# Patient Record
Sex: Female | Born: 2000 | Race: Black or African American | Hispanic: No | Marital: Single | Smoking: Never smoker
Health system: Southern US, Community
[De-identification: ages and names within clinical notes are randomized; demographics above are authoritative.]

---

## 2020-08-24 ENCOUNTER — Emergency Department (HOSPITAL_BASED_OUTPATIENT_CLINIC_OR_DEPARTMENT_OTHER)
Admission: EM | Admit: 2020-08-24 | Discharge: 2020-08-24 | Disposition: A | Discharge status: Home / Self Care | Payer: Medicaid Other | Attending: Emergency Medicine | Admitting: Emergency Medicine

## 2020-08-24 ENCOUNTER — Emergency Department (HOSPITAL_BASED_OUTPATIENT_CLINIC_OR_DEPARTMENT_OTHER): Payer: Medicaid Other | Admitting: Radiology

## 2020-08-24 ENCOUNTER — Encounter (HOSPITAL_BASED_OUTPATIENT_CLINIC_OR_DEPARTMENT_OTHER): Payer: Self-pay

## 2020-08-24 ENCOUNTER — Other Ambulatory Visit: Payer: Self-pay

## 2020-08-24 DIAGNOSIS — S00511A Abrasion of lip, initial encounter: Secondary | ICD-10-CM | POA: Diagnosis not present

## 2020-08-24 DIAGNOSIS — Y92410 Unspecified street and highway as the place of occurrence of the external cause: Secondary | ICD-10-CM | POA: Insufficient documentation

## 2020-08-24 DIAGNOSIS — S60511A Abrasion of right hand, initial encounter: Secondary | ICD-10-CM | POA: Insufficient documentation

## 2020-08-24 DIAGNOSIS — S93401A Sprain of unspecified ligament of right ankle, initial encounter: Secondary | ICD-10-CM | POA: Insufficient documentation

## 2020-08-24 DIAGNOSIS — S6991XA Unspecified injury of right wrist, hand and finger(s), initial encounter: Secondary | ICD-10-CM | POA: Diagnosis present

## 2020-08-24 DIAGNOSIS — T148XXA Other injury of unspecified body region, initial encounter: Secondary | ICD-10-CM

## 2020-08-24 MED ORDER — IBUPROFEN 600 MG PO TABS: 600.0000 mg | ORAL_TABLET | Freq: Four times a day (QID) | ORAL | 0 refills | Status: AC | PRN

## 2020-08-24 MED ORDER — HYDROCODONE-ACETAMINOPHEN 5-325 MG PO TABS
1.0000 | ORAL_TABLET | Freq: Once | ORAL | Status: AC
  Administered 2020-08-24: 1 via ORAL
  Filled 2020-08-24: qty 1

## 2020-08-24 MED ORDER — TETANUS-DIPHTH-ACELL PERTUSSIS 5-2.5-18.5 LF-MCG/0.5 IM SUSY: 0.5000 mL | PREFILLED_SYRINGE | Freq: Once | INTRAMUSCULAR | Status: DC
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
## 2021-05-18 ENCOUNTER — Encounter: Payer: Medicaid Other | Admitting: Radiology
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
